# Patient Record
Sex: Female | Born: 1995 | Race: Black or African American | Hispanic: No | Marital: Single | Smoking: Never smoker
Health system: Southern US, Community
[De-identification: ages and names within clinical notes are randomized; demographics above are authoritative.]

---
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
## 2014-04-15 IMAGING — CR DG CERVICAL SPINE COMPLETE 4+V
7 series · 7 of 7 positions shown · non-contrast
Comparison: None.

CLINICAL DATA: Left-sided neck and shoulder pain.

CERVICAL SPINE - COMPLETE 4+ VIEW

[w c-spine lat]
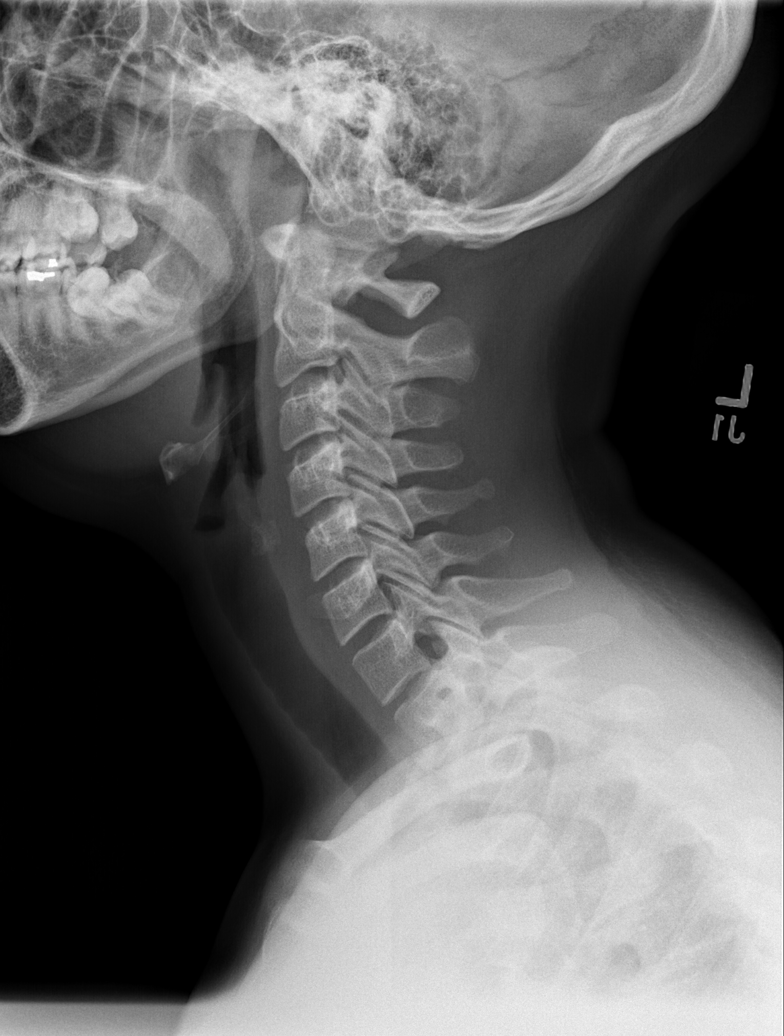

[w c-spine oblique (1 of 2)]
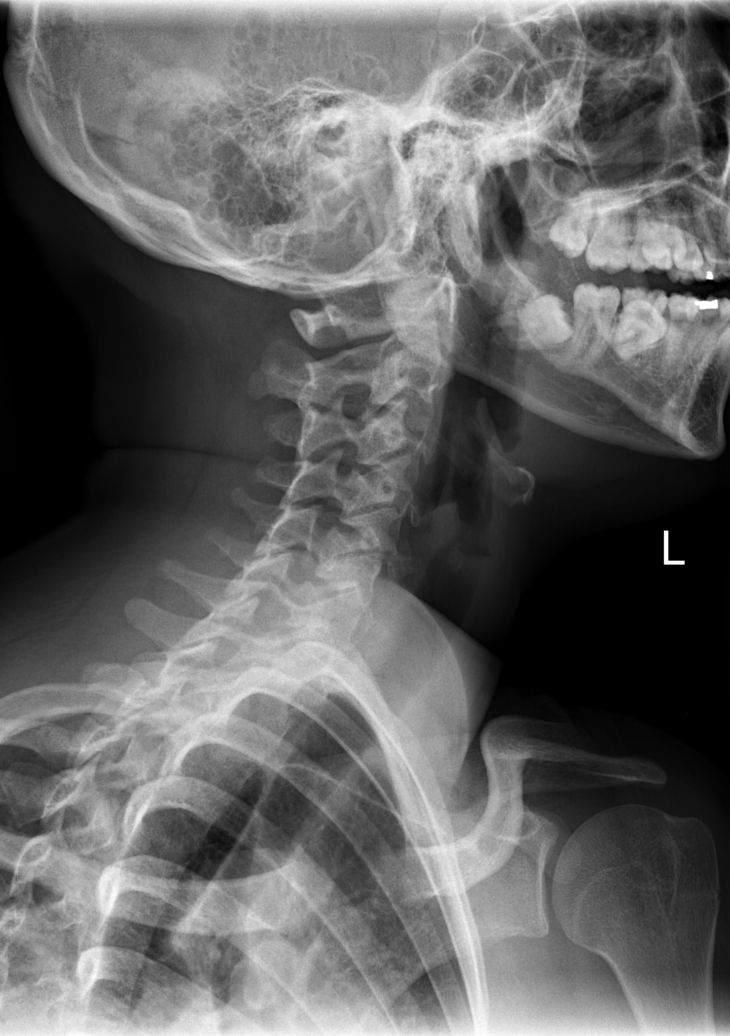

[w c-spine oblique (2 of 2)]
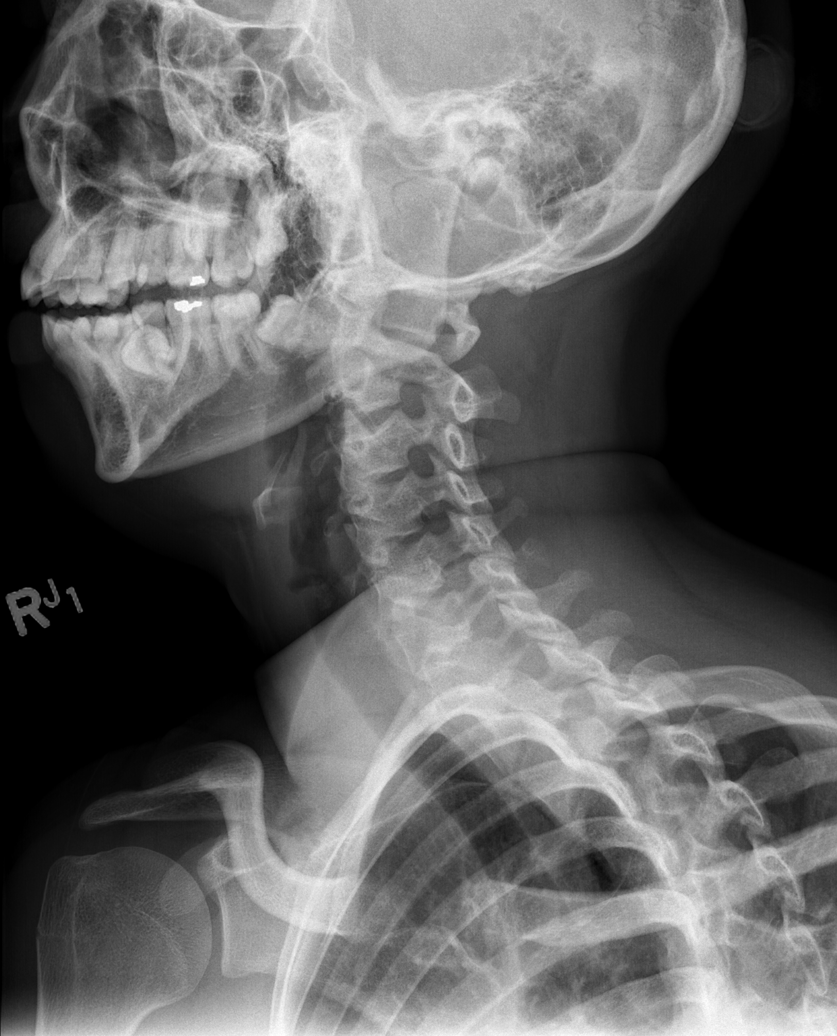

[w c-spine a.p. *]
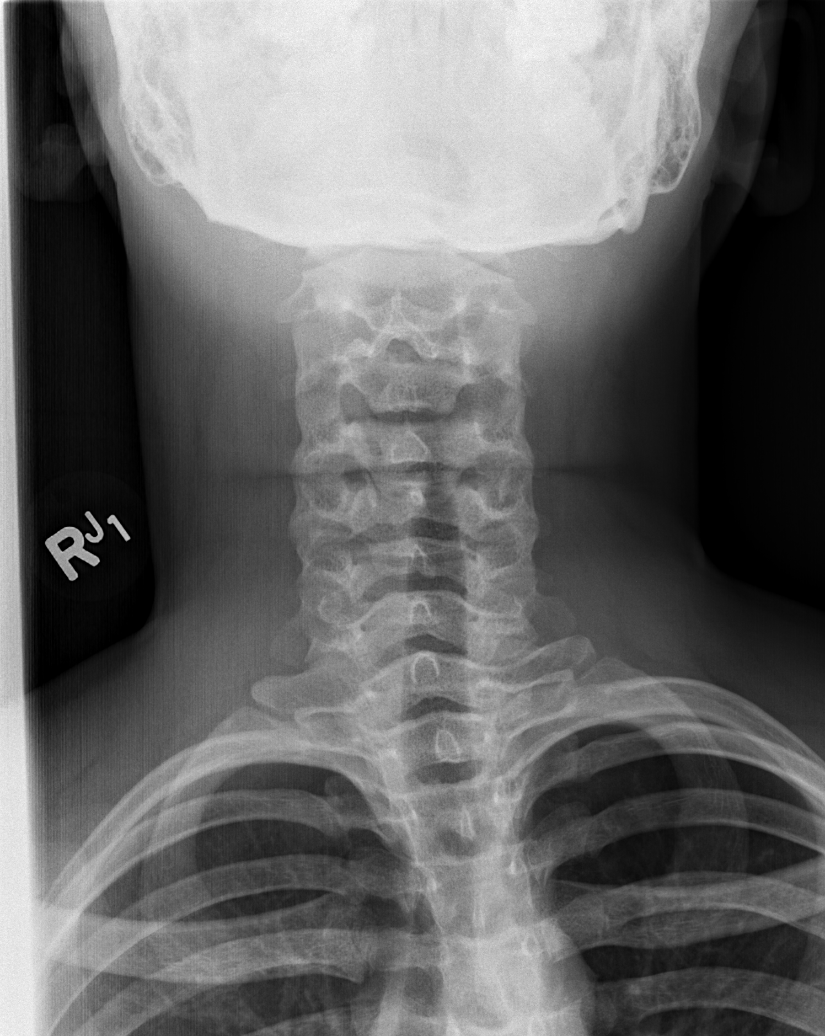

[w c-spine odontoid * (1 of 3)]
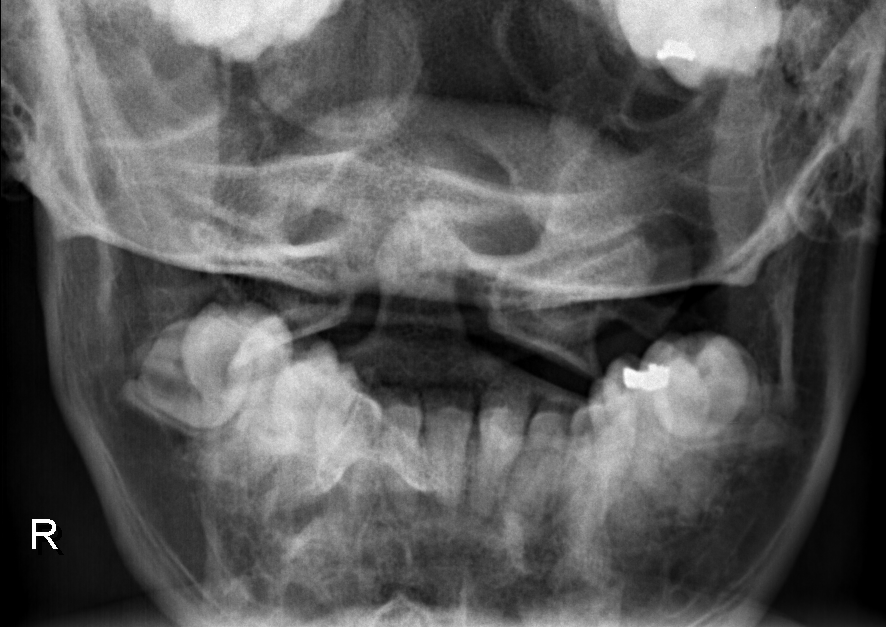

[w c-spine odontoid * (2 of 3)]
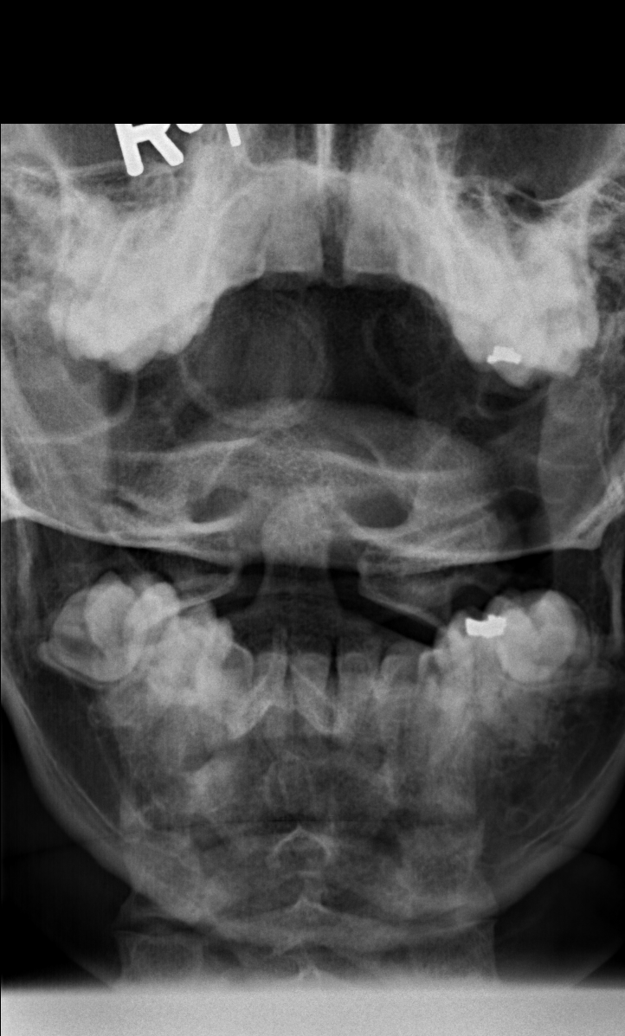

[w c-spine odontoid * (3 of 3)]
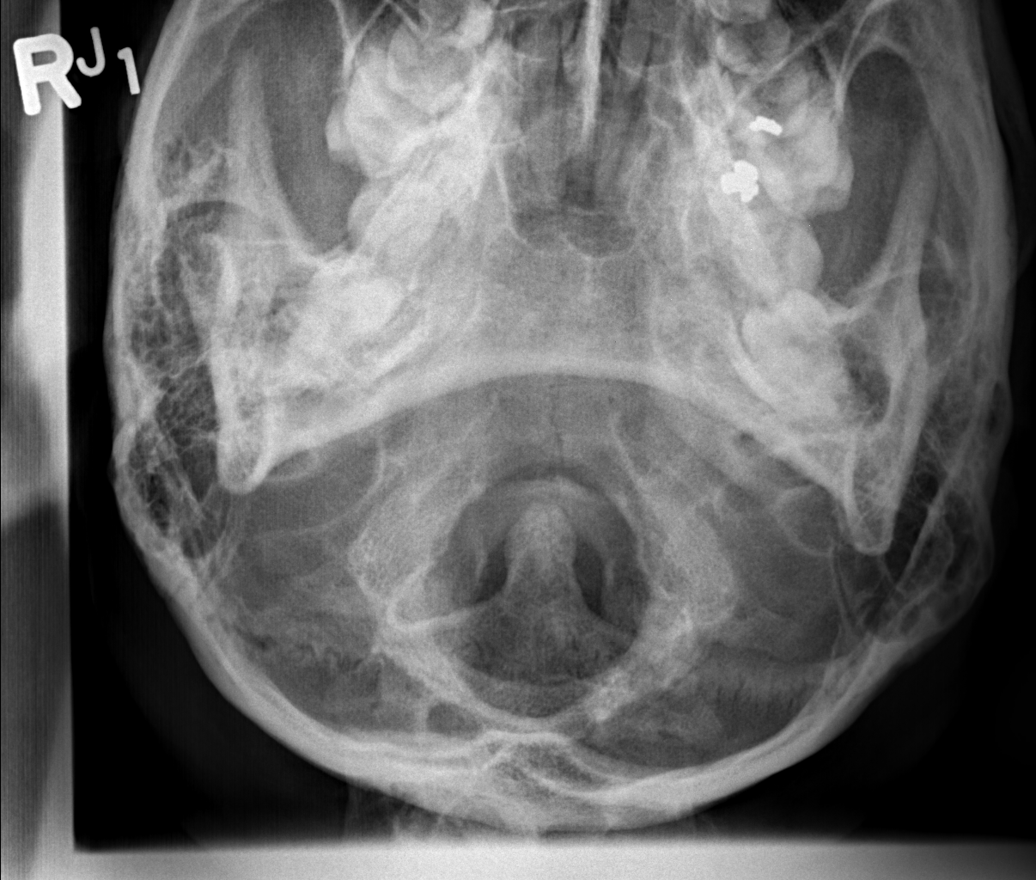

[7 of 7 positions shown; findings below may reference images not displayed]

FINDINGS: No evidence for fracture.  No subluxation.
Intervertebral disc spaces are preserved throughout.  The facets
are well-aligned bilaterally. There is no evidence for prevertebral
soft tissue swelling.
IMPRESSION: Normal exam.

## 2014-04-15 IMAGING — CR DG SHOULDER 2+V*L*
3 series · 3 of 3 positions shown · non-contrast
Comparison: None.

CLINICAL DATA: .  On the left shoulder long.  Left shoulder pain.

LEFT SHOULDER - 2+ VIEW

[w shoulder ap internal left *]
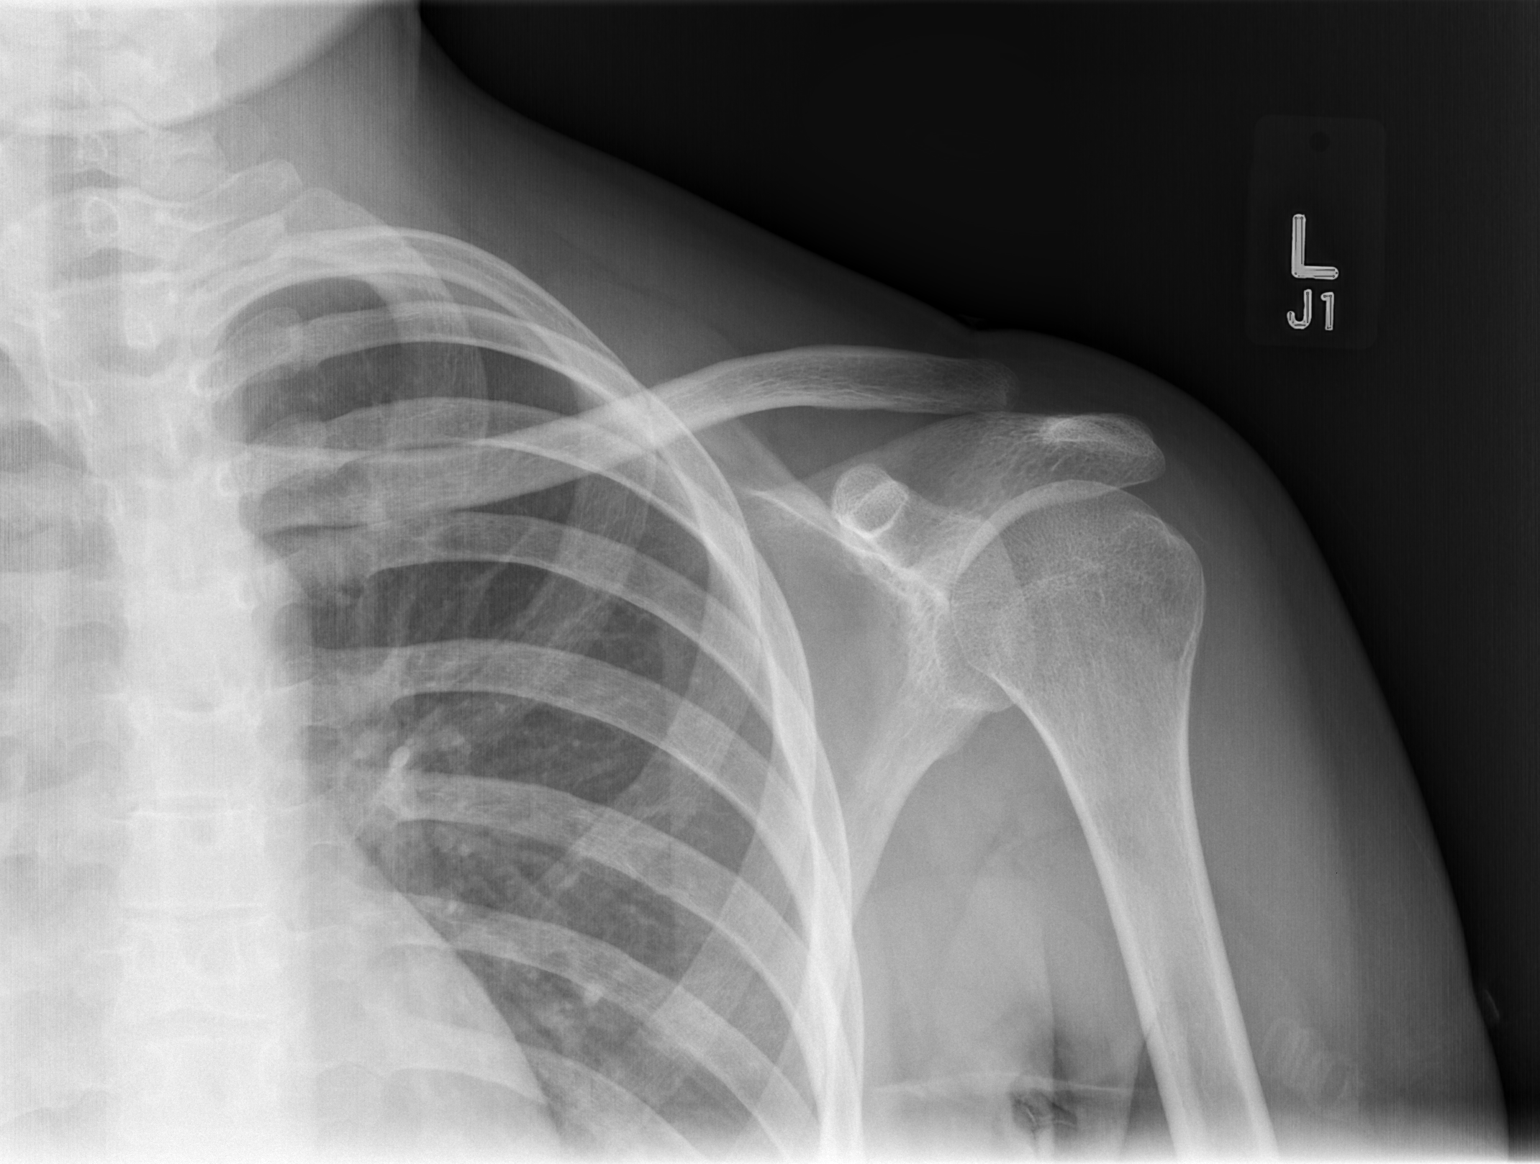

[w shoulder ap external left *]
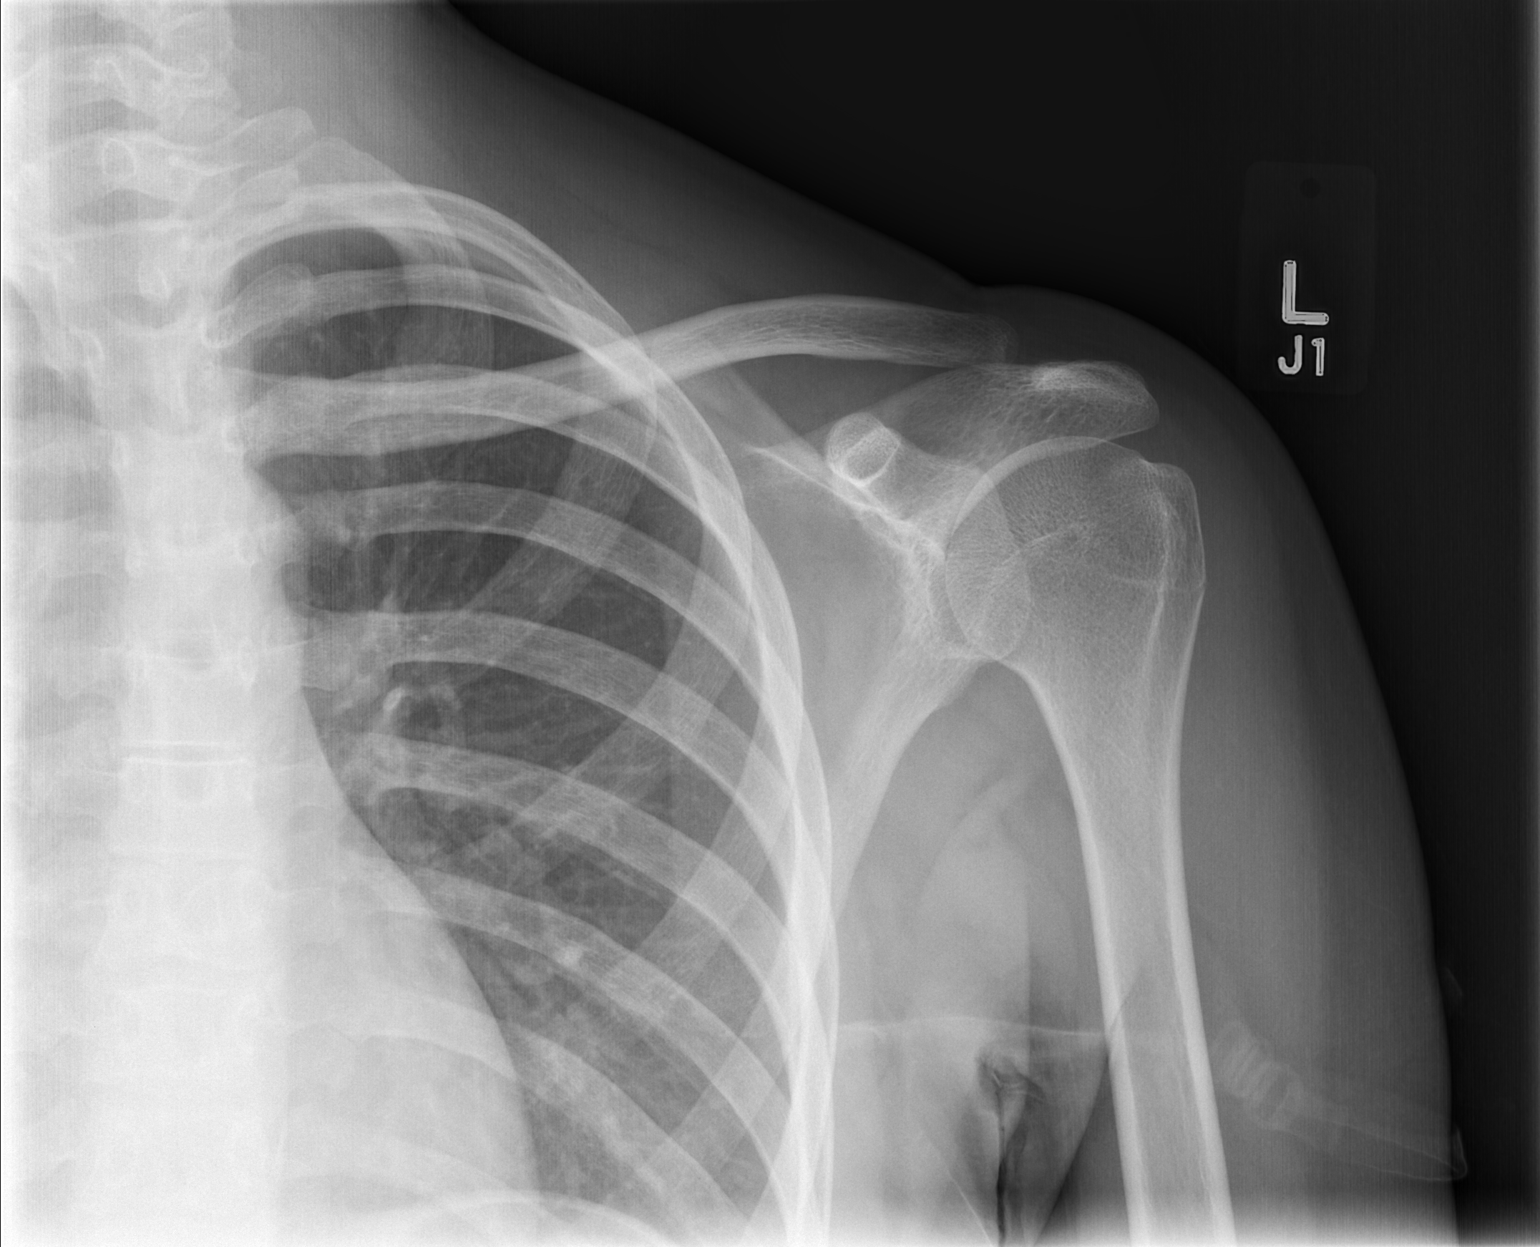

[w shoulder y view left *]
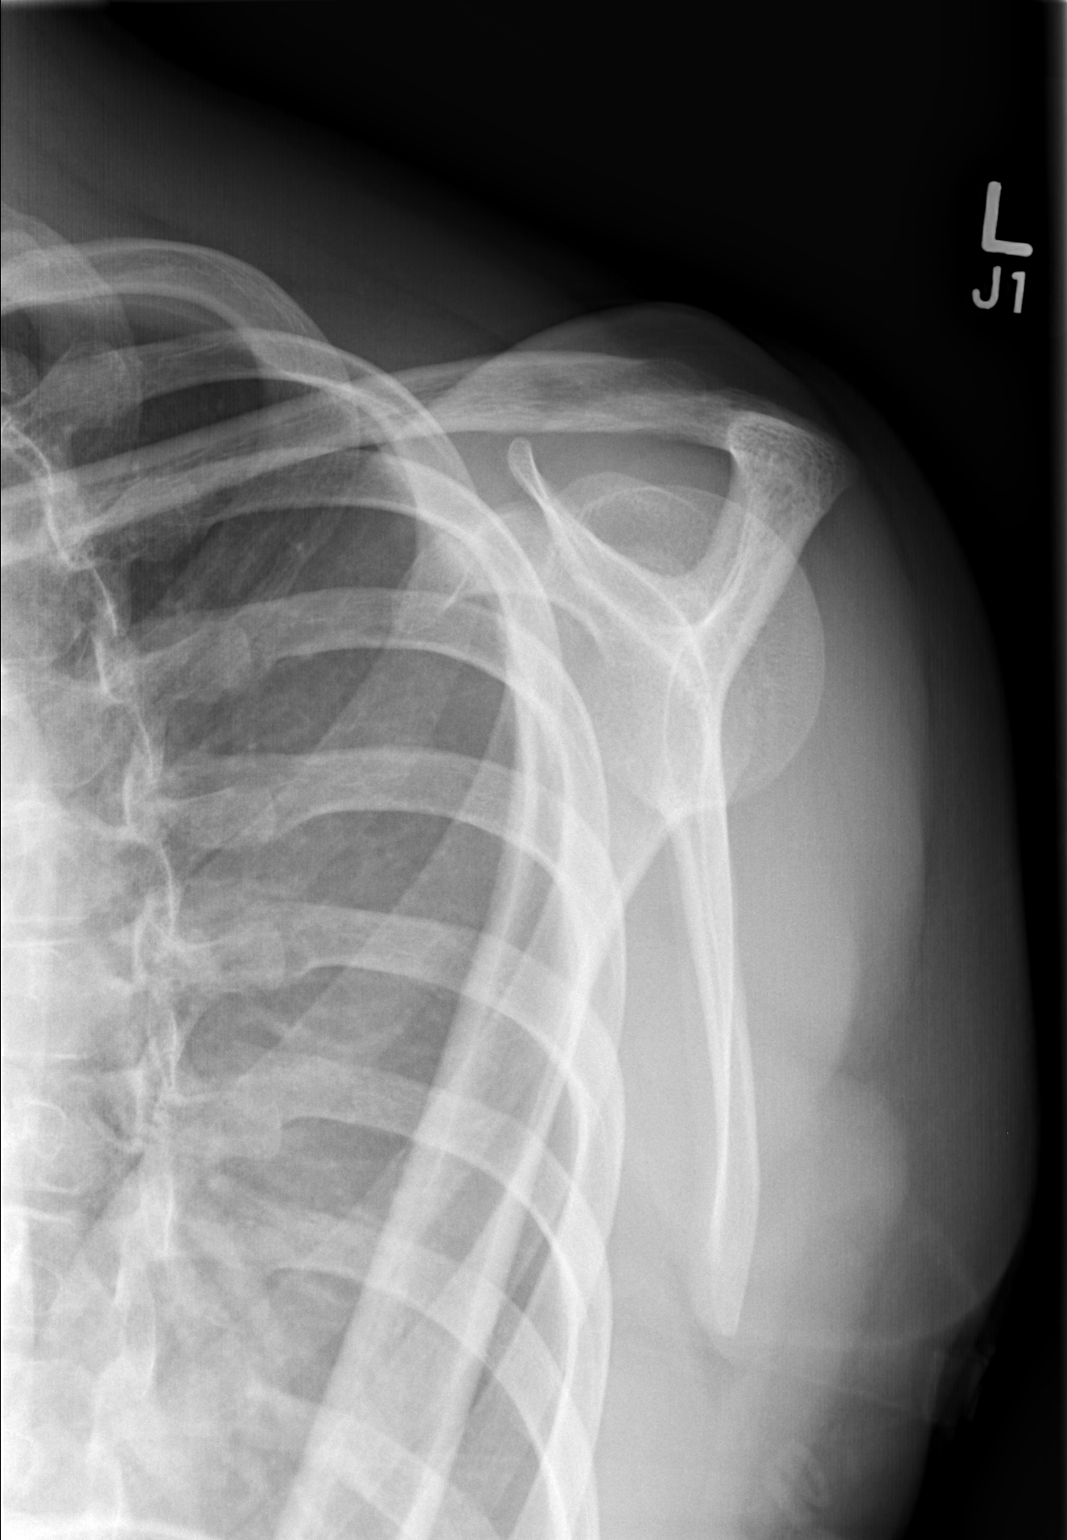

[3 of 3 positions shown; findings below may reference images not displayed]

FINDINGS: Three views study shows no evidence for fracture.  No evidence for
shoulder dislocation.  Acromioclavicular and coracoclavicular
distances are borderline widened.  No worrisome lytic or sclerotic
osseous abnormality.
IMPRESSION: No evidence for an acute fracture.

Question mild shoulder separation.  Repeat frontal radiograph with
weight bearing could be used to confirm as clinically warranted.

## 2017-03-14 ENCOUNTER — Encounter (HOSPITAL_COMMUNITY): Payer: Self-pay | Admitting: *Deleted

## 2017-03-14 ENCOUNTER — Emergency Department (HOSPITAL_COMMUNITY)
Admission: EM | Admit: 2017-03-14 | Discharge: 2017-03-14 | Disposition: A | Discharge status: Home / Self Care | Payer: BLUE CROSS/BLUE SHIELD | Attending: Emergency Medicine | Admitting: Emergency Medicine

## 2017-03-14 ENCOUNTER — Emergency Department (HOSPITAL_COMMUNITY): Payer: BLUE CROSS/BLUE SHIELD

## 2017-03-14 DIAGNOSIS — M25511 Pain in right shoulder: Secondary | ICD-10-CM

## 2017-03-14 MED ORDER — IBUPROFEN 200 MG PO TABS
600.0000 mg | ORAL_TABLET | Freq: Once | ORAL | Status: AC
  Administered 2017-03-14: 600 mg via ORAL
  Filled 2017-03-14: qty 1

## 2017-03-14 NOTE — ED Triage Notes (Signed)
Pt reports waking this morning with pain in her rt shoulder that radiated to her arm. Unsure of any injury

## 2017-03-14 NOTE — Discharge Instructions (Signed)
Your x-ray is normal. Continue to take ibuprofen and Tylenol for pain control. This may take a 1-2  weeks to get better on it's own.  Return for worsening symptoms, including arm weakness/numbness, escalating pain or any other symptom concerning to you.

## 2017-03-14 NOTE — ED Provider Notes (Signed)
[facility] Provider Note   CSN: [id number] Arrival date & time: 03/14/17  1746  By signing my name below, I, Linna Darner, attest that this documentation has been prepared under the direction and in the presence of physician practitioner, Lavera Guise, MD. Electronically Signed: Linna Darner, Scribe. 03/14/2017. 7:47 PM.  History   Chief Complaint Chief Complaint  Patient presents with  . Arm Pain    The history is provided by the patient. No language interpreter was used.     HPI Comments: Kendra Conley is a 21 y.o. female who presents to the Emergency Department complaining of constant right shoulder pain beginning about two weeks ago. 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 She states she woke up one morning following a shift with pain in her right shoulder. No falls or known trauma to her right shoulder. Pt notes her pain is worse with certain movements of her right shoulder. No medications or treatments tried. She denies joint swelling, numbness/tingling, neuro deficits, or any other associated symptoms.  History reviewed. No pertinent past medical history.  Patient Active Problem List   Diagnosis Date Noted  . PITYRIASIS ROSEA 03/21/2010    History reviewed. No pertinent surgical history.  OB History    No data available       Home Medications    Prior to Admission medications   Not on File    Family History No family history on file.  Social History Social History  Substance Use Topics  . Smoking status: Never Smoker  . Smokeless tobacco: Never Used  . Alcohol use Not on file     Allergies   Patient has no known allergies.   Review of Systems Review of Systems  10/14 systems reviewed and are negative other than those stated in the HPI  Physical Exam Updated Vital Signs BP 126/80 (BP Location: Left Arm)   Pulse 82   Temp 98.6 F (37 C) (Oral)   Resp 12   Ht 5' 6.5" (1.689 m)   Wt  173 lb 5 oz (78.6 kg)   SpO2 100%   BMI 27.55 kg/m   Physical Exam Physical Exam  Nursing note and vitals reviewed. Constitutional: Well developed, well nourished, non-toxic, and in no acute distress Head: Normocephalic and atraumatic.  Mouth/Throat: Oropharynx is clear and moist.  Neck: Normal range of motion. Neck supple.  Cardiovascular: Normal rate and regular rhythm.   Pulmonary/Chest: Effort normal and breath sounds normal.  Abdominal: Soft. There is no tenderness. There is no rebound and no guarding.  Musculoskeletal: Normal range of motion. +2 radial pulse. Intact innervation involving the radial, ulnar, median, and axillary nerves of the RUE. There is no deformity or soft tissue swelling, but there is pain with extension of the right shoulder Neurological: Alert, no facial droop, fluent speech, moves all extremities symmetrically Skin: Skin is warm and dry.  Psychiatric: Cooperative  ED Treatments / Results  Labs (all labs ordered are listed, but only abnormal results are displayed) Labs Reviewed - No data to display  EKG  EKG Interpretation None       Radiology Dg Shoulder Right  Result Date: 03/14/2017 CLINICAL DATA:  Right shoulder pain beginning 2 weeks ago. EXAM: RIGHT SHOULDER - 2+ VIEW COMPARISON:  None. FINDINGS: There is no evidence of fracture or dislocation. There is no evidence of arthropathy or other focal bone abnormality. Soft tissues are unremarkable. IMPRESSION: Negative. Electronically Signed   By: Sebastian Ache  M.D.   On: 03/14/2017 20:43    Procedures Procedures (including critical care time)  DIAGNOSTIC STUDIES: Oxygen Saturation is 100% on RA, normal by my interpretation.    COORDINATION OF CARE: 7:53 PM Discussed treatment plan with pt at bedside and pt agreed to plan.  Medications Ordered in ED Medications  ibuprofen (ADVIL,MOTRIN) tablet 600 mg (600 mg Oral Given 03/14/17 2001)     Initial Impression / Assessment and Plan / ED Course    I have reviewed the triage vital signs and the nursing notes.  Pertinent labs & imaging results that were available during my care of the patient were reviewed by me and considered in my medical decision making (see chart for details).     Presents with right shoulder pain without trauma. Extremities neurovascularly intact and no signs of injury or trauma. She does a lot of heavy lifting and repetitive movements with her arms, and I suspect may be overuse injury or strain. X-rays visualized does not show fracture or any other acute processes. Discuss continued supportive management for this.  Final Clinical Impressions(s) / ED Diagnoses   Final diagnoses:  Acute pain of right shoulder    New Prescriptions New Prescriptions   No medications on file   I personally performed the services described in this documentation, which was scribed in my presence. The recorded information has been reviewed and is accurate.    Lavera Guise, MD 03/14/17 2053

## 2017-03-14 NOTE — ED Notes (Signed)
Patient returned to room from x-ray.

## 2017-03-14 NOTE — ED Notes (Signed)
Patient transported to X-ray 

## 2021-09-04 ENCOUNTER — Other Ambulatory Visit: Payer: Self-pay

## 2021-09-04 DIAGNOSIS — L299 Pruritus, unspecified: Secondary | ICD-10-CM | POA: Insufficient documentation

## 2021-09-04 DIAGNOSIS — H1031 Unspecified acute conjunctivitis, right eye: Secondary | ICD-10-CM | POA: Diagnosis present

## 2021-09-05 ENCOUNTER — Other Ambulatory Visit: Payer: Self-pay

## 2021-09-05 ENCOUNTER — Emergency Department (HOSPITAL_COMMUNITY)
Admission: EM | Admit: 2021-09-05 | Discharge: 2021-09-05 | Disposition: A | Discharge status: Home / Self Care | Payer: BLUE CROSS/BLUE SHIELD | Attending: Physician Assistant | Admitting: Physician Assistant

## 2021-09-05 ENCOUNTER — Encounter (HOSPITAL_COMMUNITY): Payer: Self-pay | Admitting: Emergency Medicine

## 2021-09-05 DIAGNOSIS — H109 Unspecified conjunctivitis: Secondary | ICD-10-CM

## 2021-09-05 MED ORDER — ERYTHROMYCIN 5 MG/GM OP OINT: TOPICAL_OINTMENT | OPHTHALMIC | 0 refills | Status: AC

## 2021-09-05 NOTE — Discharge Instructions (Addendum)
1. Medications: erythromycin, usual home medications 2. Treatment: rest, drink plenty of fluids, wash hands frequently 3. Follow Up: Please followup with your primary doctor in 2-3 days for discussion of your diagnoses and further evaluation after today's visit; if you do not have a primary care doctor use the resource guide provided to find one; Please return to the ER for worsening symptoms

## 2021-09-05 NOTE — ED Provider Notes (Signed)
[facility] EMERGENCY DEPARTMENT Provider Note   CSN: [id number] Arrival date & time: 09/04/21  2216     History Chief Complaint  Patient presents with   Conjunctivitis    Kendra Conley is a 24 y.o. female presents to the emergency department with complaints of discharge from the right thigh.  Patient reports earlier this evening it felt somewhat itchy and then she noticed it was red, a little bit swollen and draining green mucus.  Patient reports she is not a contact lens wearer.  Has not had a rash on her face, fevers or chills.  Reports the eye feels uncomfortable but is not painful.  No pain with range of motion.  Patient denies injury or scratch to the eye.  Denies vision changes.  The history is provided by the patient and medical records. No language interpreter was used.      History reviewed. No pertinent past medical history.  Patient Active Problem List   Diagnosis Date Noted   PITYRIASIS ROSEA 03/21/2010    History reviewed. No pertinent surgical history.   OB History   No obstetric history on file.     No family history on file.  Social History   Tobacco Use   Smoking status: Never   Smokeless tobacco: Never  Substance Use Topics   Alcohol use: Never   Drug use: Never    Home Medications Prior to Admission medications   Medication Sig Start Date End Date Taking? Authorizing Provider  erythromycin ophthalmic ointment Place a 1/2 inch ribbon of ointment into the lower eyelid 3x per day 09/05/21  Yes Lauria Depoy, Dahlia Client, PA-C    Allergies    Patient has no known allergies.  Review of Systems   Review of Systems  Constitutional:  Negative for chills and fatigue.  HENT:  Negative for congestion, postnasal drip and rhinorrhea.   Eyes:  Positive for discharge, redness and itching. Negative for visual disturbance.  Respiratory:  Negative for cough and shortness of breath.   Cardiovascular:  Negative for chest pain.  Skin:  Negative  for rash.  Allergic/Immunologic: Negative for immunocompromised state.  Neurological:  Negative for headaches.   Physical Exam Updated Vital Signs BP 118/83   Pulse 90   Temp 98.7 F (37.1 C) (Oral)   Resp 18   Ht 5\' 6"  (1.676 m)   Wt 92 kg   SpO2 100%   BMI 32.74 kg/m   Physical Exam Vitals and nursing note reviewed.  Constitutional:      General: She is not in acute distress.    Appearance: She is well-developed. She is not ill-appearing.  HENT:     Head: Normocephalic.  Eyes:     General: Lids are normal. Lids are everted, no foreign bodies appreciated. Vision grossly intact. Gaze aligned appropriately. No visual field deficit or scleral icterus.       Right eye: Discharge (thick, green) present.        Left eye: No discharge.     Extraocular Movements: Extraocular movements intact.     Conjunctiva/sclera:     Right eye: Right conjunctiva is injected.     Left eye: Left conjunctiva is not injected.     Pupils: Pupils are equal, round, and reactive to light.     Slit lamp exam:    Right eye: No hyphema, anterior chamber flares or photophobia.     Left eye: No hyphema, anterior chamber flares or photophobia.  Cardiovascular:     Rate and Rhythm:  Normal rate.  Pulmonary:     Effort: Pulmonary effort is normal.  Abdominal:     General: There is no distension.  Musculoskeletal:        General: Normal range of motion.     Cervical back: Normal range of motion.  Skin:    General: Skin is warm and dry.  Neurological:     Mental Status: She is alert.  Psychiatric:        Mood and Affect: Mood normal.    ED Results / Procedures / Treatments   Labs (all labs ordered are listed, but only abnormal results are displayed) Labs Reviewed - No data to display  EKG None  Radiology No results found.  Procedures Procedures   Medications Ordered in ED Medications - No data to display  ED Course  I have reviewed the triage vital signs and the nursing  notes.  Pertinent labs & imaging results that were available during my care of the patient were reviewed by me and considered in my medical decision making (see chart for details).    MDM Rules/Calculators/A&P                           Kendra Conley presents with symptoms consistent with bacterial conjunctivitis.  Purulent discharge exam.  No entrapment, consensual photophobia. No rash to the face  Presentation non-concerning for iritis, corneal abrasions, or HSV.  No evidence of preseptal or orbital cellulitis.  Pt is NOT not a contact lens wearer.  Patient will be given erythromycin ophthalmic.  Personal hygiene and frequent handwashing discussed.  Patient advised to followup with PCPfor reevaluation in several days..  Patient verbalizes understanding and is agreeable with discharge.  Final Clinical Impression(s) / ED Diagnoses Final diagnoses:  Bacterial conjunctivitis of right eye    Rx / DC Orders ED Discharge Orders          Ordered    erythromycin ophthalmic ointment        09/05/21 0018             Kendra Conley, Boyd Kerbs 09/05/21 0033    Palumbo, April, MD 09/05/21 3086

## 2021-09-05 NOTE — ED Triage Notes (Signed)
Patient reports itchy , red and teary right eye onset this afternoon with drainage , denies injury / no vision loss.

## 2024-01-26 ENCOUNTER — Ambulatory Visit (HOSPITAL_COMMUNITY)
Admission: EM | Admit: 2024-01-26 | Discharge: 2024-01-26 | Disposition: A | Discharge status: Home / Self Care | Payer: Medicaid Other | Attending: Emergency Medicine | Admitting: Emergency Medicine

## 2024-01-26 ENCOUNTER — Encounter (HOSPITAL_COMMUNITY): Payer: Self-pay | Admitting: *Deleted

## 2024-01-26 ENCOUNTER — Other Ambulatory Visit: Payer: Self-pay

## 2024-01-26 DIAGNOSIS — M7581 Other shoulder lesions, right shoulder: Secondary | ICD-10-CM

## 2024-01-26 MED ORDER — IBUPROFEN 800 MG PO TABS: 800.0000 mg | ORAL_TABLET | Freq: Three times a day (TID) | ORAL | 0 refills | Status: AC

## 2024-01-26 NOTE — Discharge Instructions (Signed)
You appear to have inflamed your rotator cuff.  Take the 800 mg of ibuprofen every 8 hours with food to help with pain and inflammation.  You also do warm compresses, gentle stretching and the shoulder exercises I have attached.  If this issue persists please follow-up with an orthopedic for further evaluation.  Return to clinic for any new or urgent symptoms.

## 2024-01-26 NOTE — ED Provider Notes (Signed)
[facility]    CSN: [id number] Arrival date & time: 01/26/24  1204      History   Chief Complaint Chief Complaint  Patient presents with   Shoulder Pain    HPI Kendra Conley is a 28 y.o. female.   Patient presents to clinic with right shoulder pain that she noticed over the weekend.  She noticed that it was a little stiff and sore but got worse last night when she was trying to sleep.  She has been sleeping on her shoulder at night to help relieve the pain and pressure.  Denies any recent injuries or falls.  She does like to wrestle with her 11-year-old son.  Reports she has recently gotten a [place] futon mattress and it is not comfortable for her, she will wake up in pain.  Did try IcyHot, this seemed to make her pain worse.  No previous injuries to the shoulder.  The history is provided by the patient and medical records.  Shoulder Pain   History reviewed. No pertinent past medical history.  Patient Active Problem List   Diagnosis Date Noted   PITYRIASIS ROSEA 03/21/2010    History reviewed. No pertinent surgical history.  OB History   No obstetric history on file.      Home Medications    Prior to Admission medications   Medication Sig Start Date End Date Taking? Authorizing Provider  ibuprofen (ADVIL) 800 MG tablet Take 1 tablet (800 mg total) by mouth 3 (three) times daily. 01/26/24  Yes Rinaldo Ratel, [place] N, FNP  erythromycin ophthalmic ointment Place a 1/2 inch ribbon of ointment into the lower eyelid 3x per day 09/05/21   Muthersbaugh, Dahlia Client, PA-C    Family History History reviewed. No pertinent family history.  Social History Social History   Tobacco Use   Smoking status: Never   Smokeless tobacco: Never  Substance Use Topics   Alcohol use: Never   Drug use: Never     Allergies   Patient has no known allergies.   Review of Systems Review of Systems  Per HPI   Physical Exam Triage Vital Signs ED Triage Vitals  Encounter  Vitals Group     BP 01/26/24 1240 117/80     Systolic BP Percentile --      Diastolic BP Percentile --      Pulse Rate 01/26/24 1240 92     Resp 01/26/24 1240 18     Temp 01/26/24 1240 98.1 F (36.7 C)     Temp src --      SpO2 01/26/24 1240 97 %     Weight --      Height --      Head Circumference --      Peak Flow --      Pain Score 01/26/24 1236 6     Pain Loc --      Pain Education --      Exclude from Growth Chart --    No data found.  Updated Vital Signs BP 117/80   Pulse 92   Temp 98.1 F (36.7 C)   Resp 18   LMP  (LMP Unknown) Comment: last menses before christmas. No form of BC used.  SpO2 97%   Visual Acuity Right Eye Distance:   Left Eye Distance:   Bilateral Distance:    Right Eye Near:   Left Eye Near:    Bilateral Near:     Physical Exam Vitals and nursing note reviewed.  Constitutional:  Appearance: Normal appearance.  HENT:     Head: Normocephalic and atraumatic.     Right Ear: External ear normal.     Left Ear: External ear normal.     Nose: Nose normal.     Mouth/Throat:     Mouth: Mucous membranes are moist.  Eyes:     Conjunctiva/sclera: Conjunctivae normal.  Cardiovascular:     Rate and Rhythm: Normal rate.     Pulses: Normal pulses.  Pulmonary:     Effort: Pulmonary effort is normal. No respiratory distress.  Musculoskeletal:        General: No swelling, tenderness, deformity or signs of injury. Normal range of motion.     Right shoulder: No swelling or deformity. Normal range of motion. Normal strength. Normal pulse.       Arms:     Comments: Range of motion of right shoulder intact with some pain with internal rotation and empty can test.  No swelling.  No deformity.  Radial pulses 2+.  No pain with wrist or elbow range of motion.  Skin:    General: Skin is warm and dry.  Neurological:     Mental Status: She is alert.  Psychiatric:        Behavior: Behavior is cooperative.      UC Treatments / Results  Labs (all  labs ordered are listed, but only abnormal results are displayed) Labs Reviewed - No data to display  EKG   Radiology No results found.  Procedures Procedures (including critical care time)  Medications Ordered in UC Medications - No data to display  Initial Impression / Assessment and Plan / UC Course  I have reviewed the triage vital signs and the nursing notes.  Pertinent labs & imaging results that were available during my care of the patient were reviewed by me and considered in my medical decision making (see chart for details).  Vitals and triage reviewed, patient is hemodynamically stable.  Anterior right shoulder pain consistent with rotator cuff etiology.  Atraumatic, imaging deferred.  Discussed shoulder strengthening exercises, anti-inflammatories and symptomatic management.  Orthopedic follow-up encouraged if symptoms persist.  Plan of care, follow-up care and return precautions given, no questions at this time.     Final Clinical Impressions(s) / UC Diagnoses   Final diagnoses:  Rotator cuff tendinitis, right     Discharge Instructions      You appear to have inflamed your rotator cuff.  Take the 800 mg of ibuprofen every 8 hours with food to help with pain and inflammation.  You also do warm compresses, gentle stretching and the shoulder exercises I have attached.  If this issue persists please follow-up with an orthopedic for further evaluation.  Return to clinic for any new or urgent symptoms.    ED Prescriptions     Medication Sig Dispense Auth. Provider   ibuprofen (ADVIL) 800 MG tablet Take 1 tablet (800 mg total) by mouth 3 (three) times daily. 21 tablet Jennice Renegar, [place] N, [place]      PDMP not reviewed this encounter.   Elly Haffey, [place] N, [place] 01/26/24 [phone]

## 2024-01-26 NOTE — ED Triage Notes (Signed)
Pt reports Rt shoulder pain for over a week. No injury to Shoulder but Pt reports pain stared with cold weather.
